# Patient Record
Sex: Female | Born: 1990 | Race: White | Hispanic: No | Marital: Single | Smoking: Never smoker
Health system: Southern US, Community
[De-identification: ages and names within clinical notes are randomized; demographics above are authoritative.]

## PROBLEM LIST (undated history)

## (undated) DIAGNOSIS — Z789 Other specified health status: Secondary | ICD-10-CM

## (undated) HISTORY — PX: TONSILLECTOMY: SUR1361

## (undated) HISTORY — PX: WISDOM TOOTH EXTRACTION: SHX21

---

## 2013-04-19 ENCOUNTER — Other Ambulatory Visit: Payer: Self-pay | Admitting: Orthopedic Surgery

## 2013-04-19 DIAGNOSIS — M25561 Pain in right knee: Secondary | ICD-10-CM

## 2013-04-19 DIAGNOSIS — M25461 Effusion, right knee: Secondary | ICD-10-CM

## 2013-04-22 ENCOUNTER — Other Ambulatory Visit: Payer: Self-pay

## 2013-04-25 ENCOUNTER — Ambulatory Visit
Admission: RE | Admit: 2013-04-25 | Discharge: 2013-04-25 | Disposition: A | Discharge status: Home / Self Care | Payer: BC Managed Care – PPO | Source: Ambulatory Visit | Attending: Orthopedic Surgery | Admitting: Orthopedic Surgery

## 2013-04-25 DIAGNOSIS — M25461 Effusion, right knee: Secondary | ICD-10-CM

## 2013-04-25 DIAGNOSIS — M25561 Pain in right knee: Secondary | ICD-10-CM

## 2013-04-26 ENCOUNTER — Other Ambulatory Visit: Payer: Self-pay | Admitting: Orthopedic Surgery

## 2013-04-27 ENCOUNTER — Encounter (HOSPITAL_COMMUNITY): Payer: Self-pay | Admitting: Pharmacy Technician

## 2013-05-02 ENCOUNTER — Encounter (HOSPITAL_COMMUNITY): Payer: Self-pay | Admitting: *Deleted

## 2013-05-02 MED ORDER — CEFAZOLIN SODIUM-DEXTROSE 2-3 GM-% IV SOLR
2.0000 g | INTRAVENOUS | Status: AC
  Administered 2013-05-03: 2 g via INTRAVENOUS
  Filled 2013-05-02: qty 50

## 2013-05-02 NOTE — Progress Notes (Signed)
Pt denies SOB, chest pain, and being under the care of a cardiologist. Pt denies haviong a chest x ray and EKG within the last year. Pt denies having a stress test, echo., and cardiac cath. Pt made aware to stop Stop taking Aspirin and herbal medications. Do not take any NSAIDs ie: Ibuprofen, Advil, Naproxen or any medication containing Aspirin.

## 2013-05-03 ENCOUNTER — Encounter (HOSPITAL_COMMUNITY): Payer: Self-pay

## 2013-05-03 ENCOUNTER — Encounter (HOSPITAL_COMMUNITY)
Admission: RE | Disposition: A | Discharge status: Home / Self Care | Payer: Self-pay | Source: Ambulatory Visit | Attending: Orthopedic Surgery

## 2013-05-03 ENCOUNTER — Encounter (HOSPITAL_COMMUNITY): Payer: Self-pay | Admitting: Anesthesiology

## 2013-05-03 ENCOUNTER — Observation Stay (HOSPITAL_COMMUNITY)
Admission: RE | Admit: 2013-05-03 | Discharge: 2013-05-04 | Disposition: A | Discharge status: Home / Self Care | Payer: BC Managed Care – PPO | Source: Ambulatory Visit | Attending: Orthopedic Surgery | Admitting: Orthopedic Surgery

## 2013-05-03 ENCOUNTER — Ambulatory Visit (HOSPITAL_COMMUNITY): Payer: BC Managed Care – PPO | Admitting: Anesthesiology

## 2013-05-03 DIAGNOSIS — IMO0002 Reserved for concepts with insufficient information to code with codable children: Secondary | ICD-10-CM | POA: Insufficient documentation

## 2013-05-03 DIAGNOSIS — S83511S Sprain of anterior cruciate ligament of right knee, sequela: Secondary | ICD-10-CM

## 2013-05-03 DIAGNOSIS — Y9368 Activity, volleyball (beach) (court): Secondary | ICD-10-CM | POA: Insufficient documentation

## 2013-05-03 DIAGNOSIS — S83509A Sprain of unspecified cruciate ligament of unspecified knee, initial encounter: Principal | ICD-10-CM | POA: Insufficient documentation

## 2013-05-03 HISTORY — DX: Other specified health status: Z78.9

## 2013-05-03 HISTORY — PX: KNEE ARTHROSCOPY WITH MENISCAL REPAIR: SHX5653

## 2013-05-03 HISTORY — PX: ANTERIOR CRUCIATE LIGAMENT REPAIR: SHX115

## 2013-05-03 LAB — HCG, SERUM, QUALITATIVE: Preg, Serum: NEGATIVE

## 2013-05-03 LAB — CBC
MCH: 30.2 pg (ref 26.0–34.0)
MCV: 86.9 fL (ref 78.0–100.0)
Platelets: 194 10*3/uL (ref 150–400)
RBC: 4.57 MIL/uL (ref 3.87–5.11)
RDW: 12.2 % (ref 11.5–15.5)
WBC: 5.4 10*3/uL (ref 4.0–10.5)

## 2013-05-03 SURGERY — RECONSTRUCTION, KNEE, ACL
Anesthesia: General | Site: Knee | Laterality: Right | Wound class: Clean

## 2013-05-03 MED ORDER — LIDOCAINE HCL (CARDIAC) 20 MG/ML IV SOLN
INTRAVENOUS | Status: DC | PRN
  Administered 2013-05-03: 100 mg via INTRAVENOUS

## 2013-05-03 MED ORDER — METHOCARBAMOL 500 MG PO TABS
500.0000 mg | ORAL_TABLET | Freq: Four times a day (QID) | ORAL | Status: DC | PRN
  Administered 2013-05-03: 500 mg via ORAL

## 2013-05-03 MED ORDER — OXYCODONE HCL 5 MG PO TABS
5.0000 mg | ORAL_TABLET | ORAL | Status: DC | PRN
  Administered 2013-05-03: 5 mg via ORAL
  Administered 2013-05-03: 10 mg via ORAL
  Administered 2013-05-04: 5 mg via ORAL
  Administered 2013-05-04: 10 mg via ORAL
  Filled 2013-05-03: qty 2
  Filled 2013-05-03: qty 1

## 2013-05-03 MED ORDER — ONDANSETRON HCL 4 MG/2ML IJ SOLN
INTRAMUSCULAR | Status: DC | PRN
  Administered 2013-05-03: 4 mg via INTRAVENOUS

## 2013-05-03 MED ORDER — OXYCODONE HCL 5 MG PO TABS: 5.0000 mg | ORAL_TABLET | Freq: Once | ORAL | Status: DC | PRN

## 2013-05-03 MED ORDER — MORPHINE SULFATE 4 MG/ML IJ SOLN
INTRAMUSCULAR | Status: AC
  Filled 2013-05-03: qty 1

## 2013-05-03 MED ORDER — NEOSTIGMINE METHYLSULFATE 1 MG/ML IJ SOLN
INTRAMUSCULAR | Status: DC | PRN
  Administered 2013-05-03: 3 mg via INTRAVENOUS

## 2013-05-03 MED ORDER — MIDAZOLAM HCL 5 MG/5ML IJ SOLN
INTRAMUSCULAR | Status: DC | PRN
  Administered 2013-05-03: 1 mg via INTRAVENOUS

## 2013-05-03 MED ORDER — HYDROMORPHONE HCL PF 1 MG/ML IJ SOLN
INTRAMUSCULAR | Status: AC
  Filled 2013-05-03: qty 1

## 2013-05-03 MED ORDER — MORPHINE SULFATE 4 MG/ML IJ SOLN
INTRAMUSCULAR | Status: DC | PRN
  Administered 2013-05-03: 4 mg via INTRAMUSCULAR

## 2013-05-03 MED ORDER — LACTATED RINGERS IV SOLN
INTRAVENOUS | Status: DC | PRN
  Administered 2013-05-03 (×2): via INTRAVENOUS

## 2013-05-03 MED ORDER — KETOROLAC TROMETHAMINE 30 MG/ML IJ SOLN
INTRAMUSCULAR | Status: AC
  Filled 2013-05-03: qty 1

## 2013-05-03 MED ORDER — ASPIRIN 325 MG PO TABS
325.0000 mg | ORAL_TABLET | Freq: Every day | ORAL | Status: DC
  Administered 2013-05-03: 325 mg via ORAL
  Filled 2013-05-03 (×2): qty 1

## 2013-05-03 MED ORDER — HYDROMORPHONE HCL PF 1 MG/ML IJ SOLN
INTRAMUSCULAR | Status: DC | PRN
  Administered 2013-05-03 (×2): 0.5 mg via INTRAVENOUS

## 2013-05-03 MED ORDER — METOCLOPRAMIDE HCL 5 MG/ML IJ SOLN: 5.0000 mg | Freq: Three times a day (TID) | INTRAMUSCULAR | Status: DC | PRN

## 2013-05-03 MED ORDER — METOCLOPRAMIDE HCL 5 MG PO TABS: 5.0000 mg | ORAL_TABLET | Freq: Three times a day (TID) | ORAL | Status: DC | PRN

## 2013-05-03 MED ORDER — ONDANSETRON HCL 4 MG PO TABS: 4.0000 mg | ORAL_TABLET | Freq: Four times a day (QID) | ORAL | Status: DC | PRN

## 2013-05-03 MED ORDER — METHOCARBAMOL 500 MG PO TABS
ORAL_TABLET | ORAL | Status: AC
  Filled 2013-05-03: qty 1

## 2013-05-03 MED ORDER — FENTANYL CITRATE 0.05 MG/ML IJ SOLN: 50.0000 ug | Freq: Once | INTRAMUSCULAR | Status: DC

## 2013-05-03 MED ORDER — OXYCODONE HCL 5 MG/5ML PO SOLN: 5.0000 mg | Freq: Once | ORAL | Status: DC | PRN

## 2013-05-03 MED ORDER — METHOCARBAMOL 100 MG/ML IJ SOLN
500.0000 mg | Freq: Four times a day (QID) | INTRAVENOUS | Status: DC | PRN
  Filled 2013-05-03: qty 5

## 2013-05-03 MED ORDER — DIAZEPAM 5 MG/ML IJ SOLN
5.0000 mg | Freq: Once | INTRAMUSCULAR | Status: AC
  Administered 2013-05-03: 5 mg via INTRAVENOUS

## 2013-05-03 MED ORDER — HYDROMORPHONE HCL PF 1 MG/ML IJ SOLN
0.2500 mg | INTRAMUSCULAR | Status: DC | PRN
  Administered 2013-05-03 (×3): 0.5 mg via INTRAVENOUS

## 2013-05-03 MED ORDER — KETOROLAC TROMETHAMINE 30 MG/ML IJ SOLN
30.0000 mg | Freq: Four times a day (QID) | INTRAMUSCULAR | Status: DC
  Administered 2013-05-03 – 2013-05-04 (×2): 30 mg via INTRAVENOUS
  Filled 2013-05-03 (×6): qty 1

## 2013-05-03 MED ORDER — DEXAMETHASONE SODIUM PHOSPHATE 10 MG/ML IJ SOLN
INTRAMUSCULAR | Status: DC | PRN
  Administered 2013-05-03: 4 mg via INTRAVENOUS

## 2013-05-03 MED ORDER — ONDANSETRON HCL 4 MG/2ML IJ SOLN: 4.0000 mg | Freq: Four times a day (QID) | INTRAMUSCULAR | Status: DC | PRN

## 2013-05-03 MED ORDER — CLONIDINE HCL (ANALGESIA) 100 MCG/ML EP SOLN
150.0000 ug | EPIDURAL | Status: DC
  Filled 2013-05-03: qty 1.5

## 2013-05-03 MED ORDER — CHLORHEXIDINE GLUCONATE 4 % EX LIQD: 60.0000 mL | Freq: Once | CUTANEOUS | Status: DC

## 2013-05-03 MED ORDER — BUPIVACAINE HCL (PF) 0.25 % IJ SOLN
INTRAMUSCULAR | Status: DC | PRN
  Administered 2013-05-03: 10 mL via INTRA_ARTICULAR

## 2013-05-03 MED ORDER — CLONIDINE HCL (ANALGESIA) 100 MCG/ML EP SOLN
EPIDURAL | Status: DC | PRN
  Administered 2013-05-03: 75 ug via INTRA_ARTICULAR

## 2013-05-03 MED ORDER — GLYCOPYRROLATE 0.2 MG/ML IJ SOLN
INTRAMUSCULAR | Status: DC | PRN
  Administered 2013-05-03: 0.4 mg via INTRAVENOUS

## 2013-05-03 MED ORDER — POTASSIUM CHLORIDE IN NACL 20-0.9 MEQ/L-% IV SOLN
INTRAVENOUS | Status: DC
  Administered 2013-05-03: 22:00:00 via INTRAVENOUS
  Filled 2013-05-03 (×3): qty 1000

## 2013-05-03 MED ORDER — PROMETHAZINE HCL 25 MG/ML IJ SOLN: 6.2500 mg | INTRAMUSCULAR | Status: DC | PRN

## 2013-05-03 MED ORDER — CEFAZOLIN SODIUM 1-5 GM-% IV SOLN
1.0000 g | Freq: Three times a day (TID) | INTRAVENOUS | Status: AC
  Administered 2013-05-03 – 2013-05-04 (×2): 1 g via INTRAVENOUS
  Filled 2013-05-03 (×3): qty 50

## 2013-05-03 MED ORDER — MIDAZOLAM HCL 2 MG/2ML IJ SOLN
1.0000 mg | INTRAMUSCULAR | Status: DC | PRN
  Administered 2013-05-03: 2 mg via INTRAVENOUS
  Filled 2013-05-03: qty 2

## 2013-05-03 MED ORDER — DIAZEPAM 5 MG/ML IJ SOLN
INTRAMUSCULAR | Status: AC
  Filled 2013-05-03: qty 2

## 2013-05-03 MED ORDER — LACTATED RINGERS IV SOLN
INTRAVENOUS | Status: DC
  Administered 2013-05-03: 12:00:00 via INTRAVENOUS

## 2013-05-03 MED ORDER — PROPOFOL 10 MG/ML IV BOLUS
INTRAVENOUS | Status: DC | PRN
  Administered 2013-05-03: 170 mg via INTRAVENOUS

## 2013-05-03 MED ORDER — SUFENTANIL CITRATE 50 MCG/ML IV SOLN
INTRAVENOUS | Status: DC | PRN
  Administered 2013-05-03: 10 ug via INTRAVENOUS

## 2013-05-03 MED ORDER — MORPHINE SULFATE 2 MG/ML IJ SOLN
1.0000 mg | INTRAMUSCULAR | Status: DC | PRN
  Filled 2013-05-03: qty 1

## 2013-05-03 MED ORDER — SODIUM CHLORIDE 0.9 % IR SOLN
Status: DC | PRN
  Administered 2013-05-03: 3000 mL
  Administered 2013-05-03: 1000 mL
  Administered 2013-05-03 (×5): 3000 mL

## 2013-05-03 MED ORDER — FENTANYL CITRATE 0.05 MG/ML IJ SOLN
INTRAMUSCULAR | Status: DC | PRN
  Administered 2013-05-03: 50 ug via INTRAVENOUS
  Administered 2013-05-03: 200 ug via INTRAVENOUS

## 2013-05-03 MED ORDER — ROCURONIUM BROMIDE 100 MG/10ML IV SOLN
INTRAVENOUS | Status: DC | PRN
  Administered 2013-05-03: 50 mg via INTRAVENOUS

## 2013-05-03 SURGICAL SUPPLY — 88 items
ANCHOR BUTTON TIGHTROPE ABS (Orthopedic Implant) ×2 IMPLANT
ANCHOR BUTTON TIGHTROPE ACL RT (Orthopedic Implant) ×4 IMPLANT
BANDAGE ELASTIC 6 VELCRO ST LF (GAUZE/BANDAGES/DRESSINGS) ×2 IMPLANT
BANDAGE ESMARK 6X9 LF (GAUZE/BANDAGES/DRESSINGS) ×1 IMPLANT
BLADE CUDA 5.5 (BLADE) ×2 IMPLANT
BLADE CUTTER GATOR 3.5 (BLADE) ×2 IMPLANT
BLADE GREAT WHITE 4.2 (BLADE) ×2 IMPLANT
BLADE SURG 10 STRL SS (BLADE) ×2 IMPLANT
BLADE SURG 15 STRL LF DISP TIS (BLADE) ×2 IMPLANT
BLADE SURG 15 STRL SS (BLADE) ×2
BNDG ELASTIC 6X15 VLCR STRL LF (GAUZE/BANDAGES/DRESSINGS) ×2 IMPLANT
BNDG ESMARK 6X9 LF (GAUZE/BANDAGES/DRESSINGS) ×2
BONE MATRIX DEMINERALIZED 1CC (Bone Implant) ×4 IMPLANT
BUR OVAL 6.0 (BURR) ×2 IMPLANT
CINCH MENISCAL (Anchor) ×3 IMPLANT
CLOTH BEACON ORANGE TIMEOUT ST (SAFETY) ×2 IMPLANT
COVER SURGICAL LIGHT HANDLE (MISCELLANEOUS) ×2 IMPLANT
CUFF TOURNIQUET SINGLE 34IN LL (TOURNIQUET CUFF) ×2 IMPLANT
CUFF TOURNIQUET SINGLE 44IN (TOURNIQUET CUFF) IMPLANT
CUTTER FLIP 9MM (CUTTER) ×2 IMPLANT
CUTTER KNOT PUSHER 2-0 FIBERWI (INSTRUMENTS) ×2 IMPLANT
DECANTER SPIKE VIAL GLASS SM (MISCELLANEOUS) ×2 IMPLANT
DRAPE ARTHROSCOPY W/POUCH 114 (DRAPES) ×2 IMPLANT
DRAPE INCISE IOBAN 66X45 STRL (DRAPES) ×2 IMPLANT
DRAPE U-SHAPE 47X51 STRL (DRAPES) ×2 IMPLANT
DRSG PAD ABDOMINAL 8X10 ST (GAUZE/BANDAGES/DRESSINGS) ×2 IMPLANT
ELECT REM PT RETURN 9FT ADLT (ELECTROSURGICAL) ×2
ELECTRODE REM PT RTRN 9FT ADLT (ELECTROSURGICAL) ×1 IMPLANT
EVACUATOR 1/8 PVC DRAIN (DRAIN) IMPLANT
FIBERSTICK 2 (SUTURE) ×2 IMPLANT
GAUZE XEROFORM 1X8 LF (GAUZE/BANDAGES/DRESSINGS) ×2 IMPLANT
GAUZE XEROFORM 5X9 LF (GAUZE/BANDAGES/DRESSINGS) ×2 IMPLANT
GLOVE BIO SURGEON ST LM GN SZ9 (GLOVE) ×2 IMPLANT
GLOVE BIOGEL PI IND STRL 8 (GLOVE) ×1 IMPLANT
GLOVE BIOGEL PI IND STRL 9 (GLOVE) IMPLANT
GLOVE BIOGEL PI INDICATOR 8 (GLOVE) ×1
GLOVE BIOGEL PI INDICATOR 9 (GLOVE)
GLOVE SURG ORTHO 8.0 STRL STRW (GLOVE) ×2 IMPLANT
GOWN PREVENTION PLUS LG XLONG (DISPOSABLE) ×2 IMPLANT
GOWN PREVENTION PLUS XLARGE (GOWN DISPOSABLE) ×2 IMPLANT
GOWN STRL NON-REIN LRG LVL3 (GOWN DISPOSABLE) ×2 IMPLANT
IMMOBILIZER KNEE 22 UNIV (SOFTGOODS) ×2 IMPLANT
KIT BASIN OR (CUSTOM PROCEDURE TRAY) ×2 IMPLANT
KIT ROOM TURNOVER OR (KITS) ×2 IMPLANT
KIT TRANSTIBIAL (DISPOSABLE) IMPLANT
KNIFE GRAFT ACL 10MM 5952 (MISCELLANEOUS) IMPLANT
MANIFOLD NEPTUNE II (INSTRUMENTS) ×2 IMPLANT
MENISCAL CINCH (Anchor) ×6 IMPLANT
NEEDLE 18GX1X1/2 (RX/OR ONLY) (NEEDLE) ×2 IMPLANT
NEEDLE KEITH (NEEDLE) IMPLANT
NEEDLE STRAIGHT KEITH (NEEDLE) IMPLANT
NS IRRIG 1000ML POUR BTL (IV SOLUTION) ×2 IMPLANT
PACK ARTHROSCOPY DSU (CUSTOM PROCEDURE TRAY) ×2 IMPLANT
PAD ARMBOARD 7.5X6 YLW CONV (MISCELLANEOUS) ×4 IMPLANT
PAD CAST 4YDX4 CTTN HI CHSV (CAST SUPPLIES) ×1 IMPLANT
PADDING CAST COTTON 4X4 STRL (CAST SUPPLIES) ×1
PADDING CAST COTTON 6X4 STRL (CAST SUPPLIES) ×2 IMPLANT
PASSER SUT SWANSON 36MM LOOP (INSTRUMENTS) ×2 IMPLANT
PENCIL BUTTON HOLSTER BLD 10FT (ELECTRODE) IMPLANT
REAMER C 10MM (INSTRUMENTS) IMPLANT
SET ARTHROSCOPY TUBING (MISCELLANEOUS) ×1
SET ARTHROSCOPY TUBING LN (MISCELLANEOUS) ×1 IMPLANT
SPONGE GAUZE 4X4 12PLY (GAUZE/BANDAGES/DRESSINGS) ×2 IMPLANT
SPONGE LAP 4X18 X RAY DECT (DISPOSABLE) ×4 IMPLANT
SPONGE SCRUB IODOPHOR (GAUZE/BANDAGES/DRESSINGS) IMPLANT
SUCTION FRAZIER TIP 10 FR DISP (SUCTIONS) ×2 IMPLANT
SUT 2 FIBERLOOP 20 STRT BLUE (SUTURE) ×4
SUT ETHIBOND 5 LR DA (SUTURE) IMPLANT
SUT ETHILON 3 0 PS 1 (SUTURE) ×8 IMPLANT
SUT FIBERWIRE #2 38 T-5 BLUE (SUTURE) ×6
SUT MENISCAL KIT (KITS) IMPLANT
SUT PROLENE 3 0 PS 2 (SUTURE) ×2 IMPLANT
SUT VIC AB 0 CT1 27 (SUTURE) ×1
SUT VIC AB 0 CT1 27XBRD ANBCTR (SUTURE) ×1 IMPLANT
SUT VIC AB 2-0 CT1 27 (SUTURE) ×4
SUT VIC AB 2-0 CT1 TAPERPNT 27 (SUTURE) ×4 IMPLANT
SUTURE 2 FIBERLOOP 20 STRT BLU (SUTURE) ×2 IMPLANT
SUTURE FIBERWR #2 38 T-5 BLUE (SUTURE) ×3 IMPLANT
SYR 30ML LL (SYRINGE) ×2 IMPLANT
SYR 30ML SLIP (SYRINGE) ×2 IMPLANT
SYR BULB IRRIGATION 50ML (SYRINGE) ×2 IMPLANT
SYR TB 1ML LUER SLIP (SYRINGE) ×2 IMPLANT
TOWEL OR 17X24 6PK STRL BLUE (TOWEL DISPOSABLE) ×2 IMPLANT
TOWEL OR 17X26 10 PK STRL BLUE (TOWEL DISPOSABLE) ×2 IMPLANT
UNDERPAD 30X30 INCONTINENT (UNDERPADS AND DIAPERS) ×2 IMPLANT
WAND 90 DEG TURBOVAC W/CORD (SURGICAL WAND) ×2 IMPLANT
WATER STERILE IRR 1000ML POUR (IV SOLUTION) ×2 IMPLANT
WRAP KNEE MAXI GEL POST OP (GAUZE/BANDAGES/DRESSINGS) ×2 IMPLANT

## 2013-05-03 NOTE — Preoperative (Signed)
Beta Blockers   Reason not to administer Beta Blockers:Not Applicable 

## 2013-05-03 NOTE — Transfer of Care (Signed)
Immediate Anesthesia Transfer of Care Note  Patient: Deborah Delacruz  Procedure(s) Performed: Procedure(s) with comments: RIGHT KNEE ANTERIOR CRUCIATE LIGAMENT RECONSTRUCTION WITH HAMSTRING AUTOGRAFT  (Right) - Right Knee Anterior Cruciate Ligament Reconstruction, Medial Meniscal Repair KNEE ARTHROSCOPY WITH MENISCAL REPAIR Medial Meniscal Repair (Right)  Patient Location: PACU  Anesthesia Type:General  Level of Consciousness: awake, alert  and oriented  Airway & Oxygen Therapy: Patient connected to face mask  Post-op Assessment: Report given to PACU RN  Post vital signs: stable  Complications: No apparent anesthesia complications

## 2013-05-03 NOTE — H&P (Signed)
Deborah Delacruz is an 22 y.o. female.   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 who injured her right knee approximately 2-1/2-3 weeks ago. MRI scan shows anterior cruciate ligament tear medial meniscal tear. She has been rehabbing at the training room to achieve full range of motion. She describes symptomatic instability with everyday activities. She desires to return to high-level division 1 sports. She presents now for operative management of her anterior cruciate ligament and meniscal tear.  Past Medical History  Diagnosis Date  . Medical history non-contributory     Past Surgical History  Procedure Laterality Date  . Tonsillectomy    . Wisdom tooth extraction      History reviewed. No pertinent family history. Social History:  reports that she has never smoked. She has never used smokeless tobacco. She reports that  drinks alcohol. She reports that she does not use illicit drugs.  Allergies: No Known Allergies  Medications Prior to Admission  Medication Sig Dispense Refill  . ibuprofen (ADVIL,MOTRIN) 200 MG tablet Take 400 mg by mouth every 6 (six) hours as needed for pain.      Lorita Officer Triphasic (ORTHO TRI-CYCLEN, 28, PO) Take 1 tablet by mouth daily.        Results for orders placed during the hospital encounter of 05/03/13 (from the past 48 hour(s))  CBC     Status: None   Collection Time    05/03/13 11:32 AM      Result Value Range   WBC 5.4  4.0 - 10.5 K/uL   RBC 4.57  3.87 - 5.11 MIL/uL   Hemoglobin 13.8  12.0 - 15.0 g/dL   HCT 16.1  09.6 - 04.5 %   MCV 86.9  78.0 - 100.0 fL   MCH 30.2  26.0 - 34.0 pg   MCHC 34.8  30.0 - 36.0 g/dL   RDW 40.9  81.1 - 91.4 %   Platelets 194  150 - 400 K/uL  HCG, SERUM, QUALITATIVE     Status: None   Collection Time    05/03/13 11:32 AM      Result Value Range   Preg, Serum NEGATIVE  NEGATIVE   Comment:            THE  SENSITIVITY OF THIS     METHODOLOGY IS >10 mIU/mL.   No results found.  Review of Systems  Constitutional: Negative.   HENT: Negative.   Eyes: Negative.   Respiratory: Negative.   Cardiovascular: Negative.   Gastrointestinal: Negative.   Genitourinary: Negative.   Musculoskeletal: Positive for joint pain.  Skin: Negative.   Neurological: Negative.   Endo/Heme/Allergies: Negative.   Psychiatric/Behavioral: Negative.     Blood pressure 129/55, pulse 70, temperature 97.2 F (36.2 C), temperature source Oral, resp. rate 16, height 5\' 9"  (1.753 m), weight 63.6 kg (140 lb 3.4 oz), last menstrual period 04/04/2013, SpO2 100.00%. Physical Exam  Constitutional: She appears well-developed.  HENT:  Head: Normocephalic.  Eyes: Pupils are equal, round, and reactive to light.  Neck: Normal range of motion.  Cardiovascular: Normal rate.   Respiratory: Effort normal.  GI: Soft.  Neurological: She is alert.  Skin: Skin is warm.  Psychiatric: She has a normal mood and affect.   damage to the right knee demonstrates the skin is intact pedal pulses palpable range of motion is from full extension to 145 of flexion of flexion collaterals stable at 0  and 30 anterior cruciate ligament  Deficient with positive Lockman positive pivot shift PCL intact the no posterior lateral rotatory instability present  medial and lateral joint tenderness is present   Assessment/Plan Impression is right knee anterior cruciate ligament tear and medial meniscal tear plan anterior cruciate ligament reconstruction hamstring autograft medial meniscal repair versus resection risk and benefits discussed with the patient due to limited to infection nerve vessel damage knee stiffness prolonged rehabilitation also discussed patient understands risk and benefits and wished to proceed with surgery negative family history for DVT and pulmonary embolism  DEAN,GREGORY SCOTT 05/03/2013, 2:16 PM

## 2013-05-03 NOTE — OR Nursing (Signed)
Deborah Delacruz had an initial rough pacu stay then settled out comfortably after a few doses of IV meds as well as po meds. She settled out comfortably and appeared  Relaxed and  Resting comfortably upon discharge. Expressed several times her appreciation for her care.

## 2013-05-03 NOTE — Brief Op Note (Signed)
05/03/2013  5:35 PM  PATIENT:  Deborah Delacruz  22 y.o. female  PRE-OPERATIVE DIAGNOSIS:  Right Knee Anterior Cruciate Ligament Tear, Medial meniscal Tear  POST-OPERATIVE DIAGNOSIS:  Right Knee Anterior Cruciate Ligament Tear, Medial meniscal Tear  PROCEDURE:  Procedure(s): RIGHT KNEE ANTERIOR CRUCIATE LIGAMENT RECONSTRUCTION WITH HAMSTRING AUTOGRAFT  KNEE ARTHROSCOPY WITH MENISCAL REPAIR Medial Meniscal Repair  SURGEON:  Surgeon(s): Cammy Copa, MD  ASSISTANT: s vernon pa  ANESTHESIA:   general  EBL: 15 ml    Total I/O In: 1000 [I.V.:1000] Out: -   BLOOD ADMINISTERED: none  DRAINS: none   LOCAL MEDICATIONS USED:  Marcaine - ms04 - clonidine  SPECIMEN:  No Specimen  COUNTS:  YES  TOURNIQUET:  * Missing tourniquet times found for documented tourniquets in log:  [id number] *  DICTATION: .Other Dictation: Dictation Number [id number]  PLAN OF CARE: Admit for overnight observation  PATIENT DISPOSITION:  PACU - hemodynamically stable

## 2013-05-03 NOTE — Anesthesia Preprocedure Evaluation (Signed)
Anesthesia Evaluation  Patient identified by MRN, date of birth, ID band Patient awake    Reviewed: Allergy & Precautions, H&P , NPO status , Patient's Chart, lab work & pertinent test results  Airway Mallampati: I TM Distance: >3 FB Neck ROM: Full    Dental   Pulmonary  breath sounds clear to auscultation        Cardiovascular Rhythm:Regular Rate:Normal     Neuro/Psych    GI/Hepatic   Endo/Other    Renal/GU      Musculoskeletal   Abdominal   Peds  Hematology   Anesthesia Other Findings   Reproductive/Obstetrics                           Anesthesia Physical Anesthesia Plan  ASA: I  Anesthesia Plan: General   Post-op Pain Management:    Induction: Intravenous  Airway Management Planned: Oral ETT  Additional Equipment:   Intra-op Plan:   Post-operative Plan: Extubation in OR  Informed Consent: I have reviewed the patients History and Physical, chart, labs and discussed the procedure including the risks, benefits and alternatives for the proposed anesthesia with the patient or authorized representative who has indicated his/her understanding and acceptance.     Plan Discussed with: CRNA and Surgeon  Anesthesia Plan Comments:         Anesthesia Quick Evaluation  

## 2013-05-04 MED ORDER — OXYCODONE HCL 5 MG PO TABS: 5.0000 mg | ORAL_TABLET | ORAL | Status: AC | PRN

## 2013-05-04 MED ORDER — METHOCARBAMOL 500 MG PO TABS: 500.0000 mg | ORAL_TABLET | Freq: Four times a day (QID) | ORAL | Status: AC | PRN

## 2013-05-04 MED ORDER — ASPIRIN 325 MG PO TABS: 325.0000 mg | ORAL_TABLET | Freq: Every day | ORAL | Status: AC

## 2013-05-04 NOTE — Op Note (Signed)
Deborah Delacruz, Deborah Delacruz                  ACCOUNT NO.:  [id number]  MEDICAL RECORD NO.:  [id number]  LOCATION:  6N20C                        FACILITY:  [facility]  PHYSICIAN:  Burnard Bunting, M.D.    DATE OF BIRTH:  Nov 08, 1990  DATE OF PROCEDURE:  05/03/2013 DATE OF DISCHARGE:                              OPERATIVE REPORT   PREOPERATIVE DIAGNOSIS:  Right knee anterior cruciate ligament tear medial meniscal tear.  POSTOPERATIVE DIAGNOSIS:  Right knee anterior cruciate ligament tear medial meniscal tear.  PROCEDURE:  Right knee anterior cruciate ligament reconstruction hamstring autograft, semitendinosus with all inside medial meniscal repair using Arthrex cinches.  SURGEON:  Burnard Bunting, M.D.  ASSISTANT:  Maud Deed PA-C.  ANESTHESIA:  General.  ESTIMATED BLOOD LOSS:  20 mL.  TOURNIQUET TIME:  2 hours 1 minute at 250 mmHg.  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  OPERATIVE FINDINGS: 1. Examination under anesthesia, range of motion 0-145 with stability     varus valgus stress.  PCL intact.  ACL out.  No posterolateral     rotatory instability. 2. Diagnostic arthroscopy: 3. Intact patellofemoral compartment 4. Intact lateral compartment articular cartilage, and meniscus. 5. Torn ACL, intact PCL. 6. Intact medial compartment, articular cartilage, but with tear of     the posterior horn medial meniscus  volume at over 2.5 to 3 cm,     full-thickness capsular tear with only about 10% attached at the     superior aspect near the synovium.  PROCEDURE IN DETAIL:  The patient was brought to the operating room where general endotracheal anesthesia was induced.  Preoperative antibiotics were administered.  Time-out was called.  Right leg was prescrubbed with alcohol and Betadine, which was allowed to air dry, prepped with DuraPrep  solution, draped in sterile manner.  Collier Flowers was used to cover the operative field.  An Esmarch was utilized.  Tourniquet was inflated to 250 mmHg.  The graft was semitendinosus, was harvested medial through a incision over the pes tendons.  Skin and subcutaneous tissue sharply divided.  The semitendinosus was  harvested, attachment to the gastroc was visualized and cut.  Care was taken to avoid injury to saphenous nerve.  Graft was prepared on the back table to 9 mm by Maud Deed using double Arthrex tight rope technique.  Concurrently, this incision was irrigated intact.  Anterior-inferior lateral, anteroinferior medial portals were created.  Diagnostic arthroscopy was performed.  The patient had torn ACL, intact PCL, intact lateral compartment articular cartilage of meniscus tear of the posterior horn medial meniscus, along with 2.5 and 3 cm near full thickness tear, which was unstable, but not displaceable into the joint.  This was prepared with a rasp at the parameniscal synovium and the interface between the 2 flaps.  Because of the nondisplaced stability into the joint, 3 Arthrex cinches were placed with the cinch placed through the lateral portal towards the medial meniscus.  One vertical mattress, 2 horizontal mattress sutures were placed, which gave excellent stability.  At this time, notchplasty performed, over-the-top position  identified.  A 9 mm flip cutter used to drill the femoral and tibial tunnels.  The graft was passed into the tibial tunnel.  After StimuBlast was used to fill both the tibial and femoral tunnels.  The graft passed into the femoral tunnel approximately 15-18 mm, then graft passed into the tibial tunnel tension in full extension with no impingement.  Excellent stability was achieved.  Button did flip.  At this time, tourniquet was released.  All incisions were irrigated and closed using 3-0 Vicryl suture, 3-0 nylon for the portals, for the hardest incision  was closed using 3-0 using a 0 Vicryl suture, and 3-0 Vicryl suture and 3-0 pullout Prolene and Steri- Strips.  Solution of Marcaine, morphine, clonidine injected to the knee. Bulky dressing, knee immobilizer, ice pack placed.  The patient tolerated the procedure well without immediate complications.  Velna Hatchet Vernon's assistance required at all times during the case for retraction, graft preparation, limb preparation, drilling of the tunnels.  Her assistance was a medical necessity.     Burnard Bunting, M.D.     GSD/MEDQ  D:  05/03/2013  T:  05/04/2013  Job:  [id number]

## 2013-05-04 NOTE — Progress Notes (Signed)
Subjective: Pt stable - pain controlled   Objective: Vital signs in last 24 hours: Temp:  [97.2 F (36.2 C)-97.8 F (36.6 C)] 97.6 F (36.4 C) (10/01 0217) Pulse Rate:  [57-82] 62 (10/01 0217) Resp:  [10-22] 16 (10/01 0217) BP: (101-157)/(49-89) 101/49 mmHg (10/01 0217) SpO2:  [100 %] 100 % (10/01 0217) Weight:  [63.6 kg (140 lb 3.4 oz)-67.3 kg (148 lb 5.9 oz)] 67.3 kg (148 lb 5.9 oz) (09/30 1900)  Intake/Output from previous day: 09/30 0701 - 10/01 0700 In: 1600 [I.V.:1600] Out: -  Intake/Output this shift: Total I/O In: 600 [I.V.:600] Out: -   Exam:  Neurovascular intact Sensation intact distally Compartment soft  Labs:  Recent Labs  05/03/13 1132  HGB 13.8    Recent Labs  05/03/13 1132  WBC 5.4  RBC 4.57  HCT 39.7  PLT 194   No results found for this basename: NA, K, CL, CO2, BUN, CREATININE, GLUCOSE, CALCIUM,  in the last 72 hours No results found for this basename: LABPT, INR,  in the last 72 hours  Assessment/Plan: Dc today after PT - 50 percent wb rle   DEAN,GREGORY SCOTT 05/04/2013, 6:57 AM

## 2013-05-04 NOTE — Evaluation (Signed)
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 MRI scan shows anterior cruciate ligament tear medial meniscal tear. She has been rehabbing at the training room to achieve full range of motion. She describes symptomatic instability with everyday activities. She desires to return to high-level division 1 sports. She is s/p a repair for anterior cruciate ligament and medial meniscal tear.  Clinical Impression  Pt is s/p ACL and medial meniscus repair. Pt currently with functional limitations due to the deficits listed below (see PT Problem List). Pt able to amb. 200' with crutches and traverse 3 steps today.  Pt very motivated, PT recommended OPPT in order to improve strength and and ROM. Pt stated her plan is to work with athletic trainers at school but will consider PT.  Pt will benefit from skilled PT to increase their independence and safety with mobility to allow discharge.    PT Assessment  All further PT needs can be met in the next venue of care    Follow Up Recommendations  Outpatient PT    Does the patient have the potential to tolerate intense rehabilitation      Barriers to Discharge        Equipment Recommendations  None recommended by PT    Recommendations for Other Services     Frequency      Precautions / Restrictions Precautions Precautions: Fall Required Braces or Orthoses: Knee Immobilizer - Right Restrictions Weight Bearing Restrictions: Yes RLE Weight Bearing: Partial weight bearing RLE Partial Weight Bearing Percentage or Pounds: 50%   Pertinent Vitals/Pain Pt states she is experiencing a lot of pain in RLE at rest, but unable to rate. RN notified and  pt medicated prior to ambulation. Pt stated pain increased slightly during ambulation but wanted to continue. Pt positioned to comfort at end of session.      Mobility  Bed Mobility Bed Mobility: Supine to Sit;Sit to Supine;Sitting - Scoot to [place] of Bed;Scooting to HOB Supine to Sit: 5: Supervision Sitting - Scoot to Edge of Bed: 5: Supervision Sit to Supine: 5: Supervision Scooting to [facility]: 5: Supervision Details for Bed Mobility Assistance: Supervision to ensure safety and VC's for technique and to keep RLE extendend during bed mobility. Transfers Transfers: Sit to Stand;Stand to Sit Sit to Stand: 5: Supervision;From bed;With upper extremity assist Stand to Sit: 5: Supervision;With upper extremity assist;To bed Details for Transfer Assistance: Supervision to ensure safety. VC's to use crutch in R hand in order to provide additional support during sit<>stand transfers. VC's to keep RLE extended and PWB during all mobility. Ambulation/Gait Ambulation/Gait Assistance: 4: Min guard Ambulation Distance (Feet): 200 Feet Assistive device: Crutches Ambulation/Gait Assistance Details: Min guard to ensure safety due to pt reported increase in RLE pain during amb. with pt progressing to supervision. VC's for gait sequence, to encourage R foot flat vs. placing weight through ball of R foot only, and to remind pt of PWB status on RLE. Gait Pattern: Step-to pattern;Decreased stance time - right;Decreased dorsiflexion - right;Decreased hip/knee flexion - right;Antalgic Gait velocity: Decreased Stairs: Yes Stairs Assistance: 4: Min guard Stairs Assistance Details (indicate cue type and reason): Pt able to ascend/descent 3 steps in step to pattern with 2 crutches. VC's to  go up with the LLE and down with the RLE, while placing most of body wt through crutches when stepping with RLE to adhere to PWB status. Stair Management Technique: No rails;Step to pattern Number of Stairs: 3    Exercises     PT  Diagnosis: Difficulty walking;Acute pain  PT Problem List: Decreased strength;Decreased range of motion;Decreased knowledge of use of DME;Decreased knowledge of precautions;Pain PT Treatment Interventions:       PT Goals(Current goals can be found in the care plan section) Acute Rehab PT Goals Patient Stated Goal: to go home PT Goal Formulation: With patient/family Time For Goal Achievement: 05/11/13 Potential to Achieve Goals: Good  Visit Information  Last PT Received On: 05/04/13 Assistance Needed: +1 History of Present Illness: 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 MRI scan shows anterior cruciate ligament tear medial meniscal tear. She has been rehabbing at the training room to achieve full range of motion. She describes symptomatic instability with everyday activities. She desires to return to high-level division 1 sports. She is s/p a repair for anterior cruciate ligament and medial meniscal tear.       Prior Functioning  Home Living Family/patient expects to be discharged to:: Private residence Living Arrangements: Parent Available Help at Discharge: Family Type of Home: House Home Access: Stairs to enter Secretary/administrator of Steps: 3 Entrance Stairs-Rails: None Home Layout: Two level Alternate Level Stairs-Number of Steps: pt will not traverse stairs to second floor Home Equipment: Crutches Prior Function Level of Independence: Independent Communication Communication: No difficulties    Cognition  Cognition Arousal/Alertness: Awake/alert Behavior During Therapy: WFL for tasks assessed/performed Overall Cognitive Status: Within Functional Limits for tasks assessed    Extremity/Trunk Assessment Lower Extremity Assessment Lower Extremity Assessment: RLE deficits/detail;Overall WFL for tasks assessed (LLE WFL for tasks assessed) RLE Deficits / Details: Maintained R knee immobilizer  today, however, pt likely to have decreased RLE strength and ROM due to s/p R ACL and medial meniscus repair, unable to fully assess MMT due to pain.   Balance    End of Session PT - End of Session Equipment Utilized During Treatment: Gait belt;Right knee immobilizer Activity Tolerance: Patient tolerated treatment well Patient left: in bed;with call bell/phone within reach;with family/visitor present  GP Functional Assessment Tool Used: clinical judgement Functional Limitation: Mobility: Walking and moving around Mobility: Walking and Moving Around Current Status (W1191): At least 1 percent but less than 20 percent impaired, limited or restricted Mobility: Walking and Moving Around Goal Status [phone]): At least 1 percent but less than 20 percent impaired, limited or restricted Mobility: Walking and Moving Around Discharge Status [phone]): At least 1 percent but less than 20 percent impaired, limited or restricted   Sol Blazing 05/04/2013, 9:30 AM

## 2013-05-04 NOTE — Evaluation (Signed)
I have reviewed this note and agree with all findings. Kati Lunden Stieber, PT, DPT Pager: [phone]   

## 2013-05-04 NOTE — Anesthesia Postprocedure Evaluation (Signed)
  Anesthesia Post-op Note  Patient: Deborah Delacruz  Procedure(s) Performed: Procedure(s) with comments: RIGHT KNEE ANTERIOR CRUCIATE LIGAMENT RECONSTRUCTION WITH HAMSTRING AUTOGRAFT  (Right) - Right Knee Anterior Cruciate Ligament Reconstruction, Medial Meniscal Repair KNEE ARTHROSCOPY WITH MENISCAL REPAIR Medial Meniscal Repair (Right)  Patient Location: PACU  Anesthesia Type:General  Level of Consciousness: awake and alert   Airway and Oxygen Therapy: Patient Spontanous Breathing  Post-op Pain: mild  Post-op Assessment: Post-op Vital signs reviewed, Patient's Cardiovascular Status Stable, Respiratory Function Stable, Patent Airway, No signs of Nausea or vomiting and Pain level controlled  Post-op Vital Signs: Reviewed and stable  Complications: No apparent anesthesia complications

## 2013-05-06 ENCOUNTER — Encounter (HOSPITAL_COMMUNITY): Payer: Self-pay | Admitting: Orthopedic Surgery

## 2013-05-08 NOTE — Discharge Summary (Signed)
Physician Discharge Summary  Patient ID: Kameah Rawl MRN: [id number] DOB/AGE: 20-04-1991 22 y.o.  Admit date: 05/03/2013 Discharge date: 05/04/2013  Admission Diagnoses:  Right knee instability  Discharge Diagnoses:  Same  Surgeries: Procedure(s): RIGHT KNEE ANTERIOR CRUCIATE LIGAMENT RECONSTRUCTION WITH HAMSTRING AUTOGRAFT  KNEE ARTHROSCOPY WITH MENISCAL REPAIR Medial Meniscal Repair on 05/03/2013   Consultants:    Discharged Condition: Stable  Hospital Course: Deborah Delacruz is an 22 y.o. female who was admitted 05/03/2013 with a chief complaint of right knee instability, and found to have a diagnosis of acl and meniscal tear.  They were brought to the operating room on 05/03/2013 and underwent the above named procedures.  PWB with PT on POD 1 - dced in good condition with CPM to start at home  Antibiotics given:  Anti-infectives   Start     Dose/Rate Route Frequency Ordered Stop   05/03/13 2200  ceFAZolin (ANCEF) IVPB 1 g/50 mL premix     1 g 100 mL/hr over 30 Minutes Intravenous 3 times per day 05/03/13 1905 05/04/13 0626   05/03/13 0600  ceFAZolin (ANCEF) IVPB 2 g/50 mL premix     2 g 100 mL/hr over 30 Minutes Intravenous On call to O.R. 05/02/13 1414 05/03/13 1450    .  Recent vital signs:  Filed Vitals:   05/04/13 0719  BP: 111/42  Pulse: 58  Temp: 98.1 F (36.7 C)  Resp: 16    Recent laboratory studies:  Results for orders placed during the hospital encounter of 05/03/13  CBC      Result Value Range   WBC 5.4  4.0 - 10.5 K/uL   RBC 4.57  3.87 - 5.11 MIL/uL   Hemoglobin 13.8  12.0 - 15.0 g/dL   HCT 40.9  81.1 - 91.4 %   MCV 86.9  78.0 - 100.0 fL   MCH 30.2  26.0 - 34.0 pg   MCHC 34.8  30.0 - 36.0 g/dL   RDW 78.2  95.6 - 21.3 %   Platelets 194  150 - 400 K/uL  HCG, SERUM, QUALITATIVE      Result Value Range   Preg, Serum NEGATIVE  NEGATIVE    Discharge Medications:     Medication List    STOP taking these medications       ORTHO TRI-CYCLEN (28) PO       TAKE these medications       aspirin 325 MG tablet  Take 1 tablet (325 mg total) by mouth daily.     ibuprofen 200 MG tablet  Commonly known as:  ADVIL,MOTRIN  Take 400 mg by mouth every 6 (six) hours as needed for pain.     methocarbamol 500 MG tablet  Commonly known as:  ROBAXIN  Take 1 tablet (500 mg total) by mouth every 6 (six) hours as needed.     oxyCODONE 5 MG immediate release tablet  Commonly known as:  Oxy IR/ROXICODONE  Take 1-2 tablets (5-10 mg total) by mouth every 3 (three) hours as needed.        Diagnostic Studies: Mr Knee Right Wo Contrast  04/25/2013   CLINICAL DATA:  Injured knee playing volleyball.  EXAM: MRI OF THE RIGHT KNEE WITHOUT CONTRAST  TECHNIQUE: Multiplanar, multisequence MR imaging of the knee was performed. No intravenous contrast was administered.  COMPARISON:  None.  FINDINGS: MENISCI  Medial meniscus: Fairly extensive oblique coursing peripheral tear involving the entire posterior horn.  Lateral meniscus:  Intact.  LIGAMENTS  Cruciates:  Complete midsubstance ACL  tear. The PCL is intact.  Collaterals:  Intact.  CARTILAGE  Patellofemoral:  Normal.  Medial:  Normal.  Lateral:  Normal.  Joint: Moderate to large joint effusion with mild synovial thickening and patella plica. A ganglion cyst is noted just posterior to the PCL.  Popliteal Fossa:  No popliteal mass or Baker's cyst.  Extensor Mechanism: The patella retinacular structures are intact and the quadriceps and patellar tendons are intact.  Bones: There are typical pivot-shift bone contusions with an impaction injury involving the lateral femoral condyle but no discrete fracture.  IMPRESSION: 1. Complete midsubstance ACL tear with associated typical pivot-shift bone contusions. The PCL and collateral ligaments are intact. 2. Fairly extensive peripheral posterior horn medial meniscus tear. 3. Large joint effusion with synovial thickening and patellar plica. 4. Intact articular cartilage   Electronically  Signed   By: Loralie Champagne M.D.   On: 04/25/2013 15:31    Disposition: 01-Home or Self Care      Discharge Orders   Future Orders Complete By Expires   Call MD / Call 911  As directed    Comments:     If you experience chest pain or shortness of breath, CALL 911 and be transported to the hospital emergency room.  If you develope a fever above 101 F, pus (white drainage) or increased drainage or redness at the wound, or calf pain, call your surgeon's office.   Constipation Prevention  As directed    Comments:     Drink plenty of fluids.  Prune juice may be helpful.  You may use a stool softener, such as Colace (over the counter) 100 mg twice a day.  Use MiraLax (over the counter) for constipation as needed.   Diet - low sodium heart healthy  As directed    Discharge instructions  As directed    Comments:     OK for 50 % weight bearing on right leg with crutches Always rest with pillow under heel CPM machine today 0 - 30 degrees for one hour then tomorrow 1 hour 3 times a day increase degrees daily Change dressing tomorrow - rewrap knee with ace wrap and keep incisions dry   Increase activity slowly as tolerated  As directed          Signed: DEAN,GREGORY SCOTT 05/08/2013, 10:43 AM

## 2014-06-01 ENCOUNTER — Other Ambulatory Visit: Payer: Self-pay | Admitting: Orthopedic Surgery

## 2014-06-01 DIAGNOSIS — M25561 Pain in right knee: Secondary | ICD-10-CM

## 2014-06-15 ENCOUNTER — Ambulatory Visit
Admission: RE | Admit: 2014-06-15 | Discharge: 2014-06-15 | Disposition: A | Discharge status: Home / Self Care | Payer: BC Managed Care – PPO | Source: Ambulatory Visit | Attending: Orthopedic Surgery | Admitting: Orthopedic Surgery

## 2014-06-15 DIAGNOSIS — M25561 Pain in right knee: Secondary | ICD-10-CM

## 2014-06-15 MED ORDER — IOHEXOL 180 MG/ML  SOLN
20.0000 mL | Freq: Once | INTRAMUSCULAR | Status: AC | PRN
Start: 2014-06-15 — End: 2014-06-15
  Administered 2014-06-15: 20 mL via INTRA_ARTICULAR

## 2016-05-16 IMAGING — RF DG FLUORO GUIDE NDL PLC/BX
1 series · 1 of 1 positions shown · non-contrast
Comparison: none

CLINICAL DATA: Recurrent right knee pain. Personal history of ACL
and meniscal repair.

[Series 1: (hospital) · 1 of 1 slices shown]
[im 1/1]
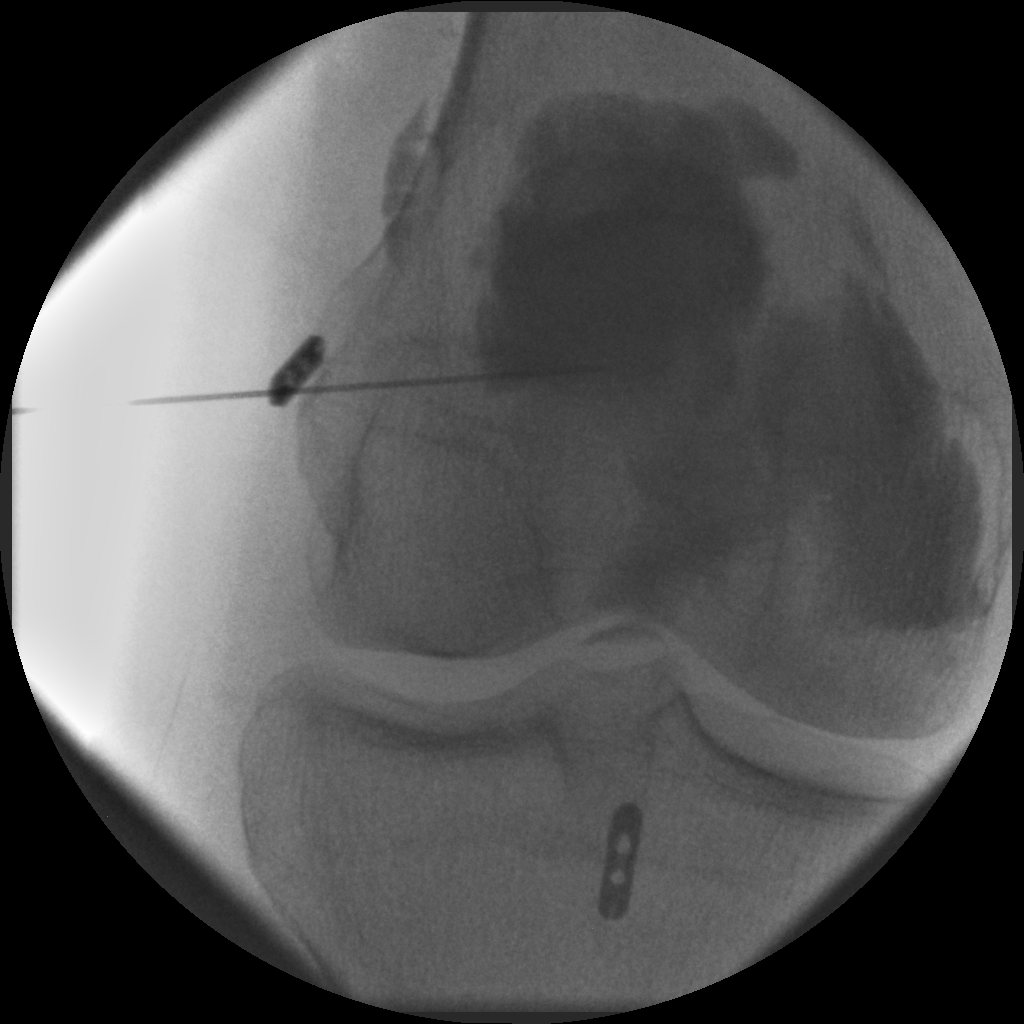

[1 of 1 positions shown; findings below may reference images not displayed]

FLUOROSCOPY TIME:  There min 46 seconds

PROCEDURE:
RIGHT KNEE INJECTION UNDER FLUOROSCOPY

An appropriate skin entrance site was determined. The site was
marked, prepped with Betadine, draped in the usual sterile fashion,
and infiltrated locally with buffered Lidocaine. 22 gauge spinal
needle was advanced to the patellofemoral joint under intermittent
fluoroscopy. 1 ml of Lidocaine injected easily. A mixture of 0.1 ml
Multihance and 20 ml of dilute Omnipaque 180 was then used to
opacify the right knee joint. No immediate complication.
IMPRESSION: Technically successful right knee injection for MRI.

## 2016-06-13 ENCOUNTER — Other Ambulatory Visit: Payer: Self-pay | Admitting: Physician Assistant
